# Patient Record
Sex: Male | Born: 1970
Health system: Southern US, Community
[De-identification: ages and names within clinical notes are randomized; demographics above are authoritative.]

## PROBLEM LIST (undated history)

## (undated) DIAGNOSIS — N400 Enlarged prostate without lower urinary tract symptoms: Secondary | ICD-10-CM

## (undated) DIAGNOSIS — G5621 Lesion of ulnar nerve, right upper limb: Secondary | ICD-10-CM

## (undated) DIAGNOSIS — E785 Hyperlipidemia, unspecified: Secondary | ICD-10-CM

## (undated) DIAGNOSIS — J309 Allergic rhinitis, unspecified: Secondary | ICD-10-CM

## (undated) DIAGNOSIS — G4700 Insomnia, unspecified: Secondary | ICD-10-CM

## (undated) DIAGNOSIS — S2239XA Fracture of one rib, unspecified side, initial encounter for closed fracture: Secondary | ICD-10-CM

## (undated) DIAGNOSIS — K5790 Diverticulosis of intestine, part unspecified, without perforation or abscess without bleeding: Secondary | ICD-10-CM

## (undated) DIAGNOSIS — N200 Calculus of kidney: Secondary | ICD-10-CM

## (undated) HISTORY — DX: Hyperlipidemia, unspecified: E78.5

## (undated) HISTORY — DX: Insomnia, unspecified: G47.00

## (undated) HISTORY — PX: OTHER SURGICAL HISTORY: SHX169

## (undated) HISTORY — DX: Allergic rhinitis, unspecified: J30.9

## (undated) HISTORY — DX: Calculus of kidney: N20.0

## (undated) HISTORY — PX: VASECTOMY: SHX75

## (undated) HISTORY — DX: Lesion of ulnar nerve, right upper limb: G56.21

## (undated) HISTORY — DX: Fracture of one rib, unspecified side, initial encounter for closed fracture: S22.39XA

## (undated) HISTORY — DX: Diverticulosis of intestine, part unspecified, without perforation or abscess without bleeding: K57.90

## (undated) HISTORY — DX: Benign prostatic hyperplasia without lower urinary tract symptoms: N40.0

---

## 1998-06-05 ENCOUNTER — Emergency Department (HOSPITAL_COMMUNITY): Admission: EM | Admit: 1998-06-05 | Discharge: 1998-06-05 | Payer: Self-pay

## 2009-08-01 ENCOUNTER — Ambulatory Visit (HOSPITAL_BASED_OUTPATIENT_CLINIC_OR_DEPARTMENT_OTHER): Admission: RE | Admit: 2009-08-01 | Discharge: 2009-08-01 | Payer: Self-pay | Admitting: Orthopedic Surgery

## 2012-01-15 HISTORY — PX: MOLE REMOVAL: SHX2046

## 2013-07-19 ENCOUNTER — Ambulatory Visit
Admission: RE | Admit: 2013-07-19 | Discharge: 2013-07-19 | Disposition: A | Discharge status: Home / Self Care | Payer: BC Managed Care – PPO | Source: Ambulatory Visit | Attending: Internal Medicine | Admitting: Internal Medicine

## 2013-07-19 ENCOUNTER — Other Ambulatory Visit: Payer: Self-pay | Admitting: Internal Medicine

## 2013-07-19 DIAGNOSIS — M549 Dorsalgia, unspecified: Secondary | ICD-10-CM

## 2013-12-02 ENCOUNTER — Encounter: Payer: Self-pay | Admitting: Internal Medicine

## 2013-12-02 NOTE — Telephone Encounter (Signed)
Erroneous encounter

## 2015-01-26 ENCOUNTER — Other Ambulatory Visit: Payer: Self-pay | Admitting: Orthopedic Surgery

## 2015-01-26 ENCOUNTER — Ambulatory Visit
Admission: RE | Admit: 2015-01-26 | Discharge: 2015-01-26 | Disposition: A | Discharge status: Home / Self Care | Payer: BLUE CROSS/BLUE SHIELD | Source: Ambulatory Visit | Attending: Orthopedic Surgery | Admitting: Orthopedic Surgery

## 2015-01-26 DIAGNOSIS — S2220XA Unspecified fracture of sternum, initial encounter for closed fracture: Secondary | ICD-10-CM

## 2015-01-26 DIAGNOSIS — S2239XA Fracture of one rib, unspecified side, initial encounter for closed fracture: Secondary | ICD-10-CM

## 2015-05-19 DIAGNOSIS — J069 Acute upper respiratory infection, unspecified: Secondary | ICD-10-CM | POA: Diagnosis not present

## 2015-06-18 DIAGNOSIS — S0502XD Injury of conjunctiva and corneal abrasion without foreign body, left eye, subsequent encounter: Secondary | ICD-10-CM | POA: Diagnosis not present

## 2015-10-11 DIAGNOSIS — Z23 Encounter for immunization: Secondary | ICD-10-CM | POA: Diagnosis not present

## 2015-11-23 DIAGNOSIS — Z0000 Encounter for general adult medical examination without abnormal findings: Secondary | ICD-10-CM | POA: Diagnosis not present

## 2016-01-22 DIAGNOSIS — Z9852 Vasectomy status: Secondary | ICD-10-CM | POA: Diagnosis not present

## 2016-02-09 DIAGNOSIS — D225 Melanocytic nevi of trunk: Secondary | ICD-10-CM | POA: Diagnosis not present

## 2016-02-09 DIAGNOSIS — D1801 Hemangioma of skin and subcutaneous tissue: Secondary | ICD-10-CM | POA: Diagnosis not present

## 2016-02-09 DIAGNOSIS — L820 Inflamed seborrheic keratosis: Secondary | ICD-10-CM | POA: Diagnosis not present

## 2016-02-09 DIAGNOSIS — D2272 Melanocytic nevi of left lower limb, including hip: Secondary | ICD-10-CM | POA: Diagnosis not present

## 2016-02-09 DIAGNOSIS — L918 Other hypertrophic disorders of the skin: Secondary | ICD-10-CM | POA: Diagnosis not present

## 2016-02-09 DIAGNOSIS — L821 Other seborrheic keratosis: Secondary | ICD-10-CM | POA: Diagnosis not present

## 2016-10-07 DIAGNOSIS — M79675 Pain in left toe(s): Secondary | ICD-10-CM | POA: Diagnosis not present

## 2016-10-30 DIAGNOSIS — L601 Onycholysis: Secondary | ICD-10-CM | POA: Diagnosis not present

## 2016-10-30 DIAGNOSIS — L820 Inflamed seborrheic keratosis: Secondary | ICD-10-CM | POA: Diagnosis not present

## 2017-01-08 DIAGNOSIS — R1031 Right lower quadrant pain: Secondary | ICD-10-CM | POA: Diagnosis not present

## 2017-04-03 DIAGNOSIS — L603 Nail dystrophy: Secondary | ICD-10-CM | POA: Diagnosis not present

## 2017-04-03 DIAGNOSIS — L738 Other specified follicular disorders: Secondary | ICD-10-CM | POA: Diagnosis not present

## 2017-04-03 DIAGNOSIS — D1801 Hemangioma of skin and subcutaneous tissue: Secondary | ICD-10-CM | POA: Diagnosis not present

## 2017-04-03 DIAGNOSIS — L820 Inflamed seborrheic keratosis: Secondary | ICD-10-CM | POA: Diagnosis not present

## 2017-04-03 DIAGNOSIS — L609 Nail disorder, unspecified: Secondary | ICD-10-CM | POA: Diagnosis not present

## 2017-04-03 DIAGNOSIS — D225 Melanocytic nevi of trunk: Secondary | ICD-10-CM | POA: Diagnosis not present

## 2017-09-04 DIAGNOSIS — M79605 Pain in left leg: Secondary | ICD-10-CM | POA: Diagnosis not present

## 2017-09-08 DIAGNOSIS — M79605 Pain in left leg: Secondary | ICD-10-CM | POA: Diagnosis not present

## 2017-09-11 DIAGNOSIS — M79605 Pain in left leg: Secondary | ICD-10-CM | POA: Diagnosis not present

## 2017-09-16 DIAGNOSIS — M79605 Pain in left leg: Secondary | ICD-10-CM | POA: Diagnosis not present

## 2017-09-19 DIAGNOSIS — M79605 Pain in left leg: Secondary | ICD-10-CM | POA: Diagnosis not present

## 2017-09-23 DIAGNOSIS — M79605 Pain in left leg: Secondary | ICD-10-CM | POA: Diagnosis not present

## 2017-09-26 DIAGNOSIS — M79605 Pain in left leg: Secondary | ICD-10-CM | POA: Diagnosis not present

## 2017-09-30 DIAGNOSIS — M79605 Pain in left leg: Secondary | ICD-10-CM | POA: Diagnosis not present

## 2017-10-09 DIAGNOSIS — S20212A Contusion of left front wall of thorax, initial encounter: Secondary | ICD-10-CM | POA: Diagnosis not present

## 2017-12-09 DIAGNOSIS — Z23 Encounter for immunization: Secondary | ICD-10-CM | POA: Diagnosis not present

## 2017-12-09 DIAGNOSIS — Z1211 Encounter for screening for malignant neoplasm of colon: Secondary | ICD-10-CM | POA: Diagnosis not present

## 2017-12-09 DIAGNOSIS — Z0000 Encounter for general adult medical examination without abnormal findings: Secondary | ICD-10-CM | POA: Diagnosis not present

## 2017-12-09 DIAGNOSIS — G5621 Lesion of ulnar nerve, right upper limb: Secondary | ICD-10-CM | POA: Diagnosis not present

## 2018-02-02 DIAGNOSIS — M79601 Pain in right arm: Secondary | ICD-10-CM | POA: Diagnosis not present

## 2018-02-02 DIAGNOSIS — M25511 Pain in right shoulder: Secondary | ICD-10-CM | POA: Diagnosis not present

## 2018-02-02 DIAGNOSIS — M79602 Pain in left arm: Secondary | ICD-10-CM | POA: Diagnosis not present

## 2018-02-09 DIAGNOSIS — M25511 Pain in right shoulder: Secondary | ICD-10-CM | POA: Diagnosis not present

## 2018-02-17 DIAGNOSIS — M25511 Pain in right shoulder: Secondary | ICD-10-CM | POA: Diagnosis not present

## 2018-02-26 DIAGNOSIS — M25511 Pain in right shoulder: Secondary | ICD-10-CM | POA: Diagnosis not present

## 2018-03-23 DIAGNOSIS — J0100 Acute maxillary sinusitis, unspecified: Secondary | ICD-10-CM | POA: Diagnosis not present

## 2018-05-28 DIAGNOSIS — L821 Other seborrheic keratosis: Secondary | ICD-10-CM | POA: Diagnosis not present

## 2018-05-28 DIAGNOSIS — D485 Neoplasm of uncertain behavior of skin: Secondary | ICD-10-CM | POA: Diagnosis not present

## 2018-05-28 DIAGNOSIS — D225 Melanocytic nevi of trunk: Secondary | ICD-10-CM | POA: Diagnosis not present

## 2018-05-28 DIAGNOSIS — L570 Actinic keratosis: Secondary | ICD-10-CM | POA: Diagnosis not present

## 2018-05-28 DIAGNOSIS — B078 Other viral warts: Secondary | ICD-10-CM | POA: Diagnosis not present

## 2018-05-28 DIAGNOSIS — L738 Other specified follicular disorders: Secondary | ICD-10-CM | POA: Diagnosis not present

## 2018-05-28 DIAGNOSIS — L814 Other melanin hyperpigmentation: Secondary | ICD-10-CM | POA: Diagnosis not present

## 2018-10-23 DIAGNOSIS — H109 Unspecified conjunctivitis: Secondary | ICD-10-CM | POA: Diagnosis not present

## 2018-11-07 DIAGNOSIS — Z20828 Contact with and (suspected) exposure to other viral communicable diseases: Secondary | ICD-10-CM | POA: Diagnosis not present

## 2018-12-09 ENCOUNTER — Other Ambulatory Visit: Payer: Self-pay

## 2018-12-09 DIAGNOSIS — Z20822 Contact with and (suspected) exposure to covid-19: Secondary | ICD-10-CM

## 2018-12-10 LAB — NOVEL CORONAVIRUS, NAA: SARS-CoV-2, NAA: NOT DETECTED

## 2018-12-23 DIAGNOSIS — Z125 Encounter for screening for malignant neoplasm of prostate: Secondary | ICD-10-CM | POA: Diagnosis not present

## 2018-12-23 DIAGNOSIS — Z23 Encounter for immunization: Secondary | ICD-10-CM | POA: Diagnosis not present

## 2018-12-23 DIAGNOSIS — Z13220 Encounter for screening for lipoid disorders: Secondary | ICD-10-CM | POA: Diagnosis not present

## 2018-12-23 DIAGNOSIS — Z0000 Encounter for general adult medical examination without abnormal findings: Secondary | ICD-10-CM | POA: Diagnosis not present

## 2019-06-21 DIAGNOSIS — R05 Cough: Secondary | ICD-10-CM | POA: Diagnosis not present

## 2019-06-21 DIAGNOSIS — J0110 Acute frontal sinusitis, unspecified: Secondary | ICD-10-CM | POA: Diagnosis not present

## 2019-06-21 DIAGNOSIS — J302 Other seasonal allergic rhinitis: Secondary | ICD-10-CM | POA: Diagnosis not present

## 2019-07-21 DIAGNOSIS — L821 Other seborrheic keratosis: Secondary | ICD-10-CM | POA: Diagnosis not present

## 2019-07-21 DIAGNOSIS — D1801 Hemangioma of skin and subcutaneous tissue: Secondary | ICD-10-CM | POA: Diagnosis not present

## 2019-07-21 DIAGNOSIS — B078 Other viral warts: Secondary | ICD-10-CM | POA: Diagnosis not present

## 2019-07-21 DIAGNOSIS — D2271 Melanocytic nevi of right lower limb, including hip: Secondary | ICD-10-CM | POA: Diagnosis not present

## 2019-08-17 DIAGNOSIS — Z20822 Contact with and (suspected) exposure to covid-19: Secondary | ICD-10-CM | POA: Diagnosis not present

## 2019-11-29 ENCOUNTER — Ambulatory Visit (INDEPENDENT_AMBULATORY_CARE_PROVIDER_SITE_OTHER): Payer: BC Managed Care – PPO | Admitting: Family Medicine

## 2019-11-29 ENCOUNTER — Other Ambulatory Visit: Payer: Self-pay

## 2019-11-29 DIAGNOSIS — M545 Low back pain, unspecified: Secondary | ICD-10-CM
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
## 2020-01-21 DIAGNOSIS — Z23 Encounter for immunization: Secondary | ICD-10-CM | POA: Diagnosis not present

## 2020-01-21 DIAGNOSIS — Z125 Encounter for screening for malignant neoplasm of prostate: Secondary | ICD-10-CM | POA: Diagnosis not present

## 2020-01-21 DIAGNOSIS — Z13220 Encounter for screening for lipoid disorders: Secondary | ICD-10-CM | POA: Diagnosis not present

## 2020-01-21 DIAGNOSIS — Z0000 Encounter for general adult medical examination without abnormal findings: Secondary | ICD-10-CM | POA: Diagnosis not present

## 2020-07-14 DIAGNOSIS — Z1211 Encounter for screening for malignant neoplasm of colon: Secondary | ICD-10-CM | POA: Diagnosis not present

## 2020-08-07 DIAGNOSIS — M79605 Pain in left leg: Secondary | ICD-10-CM | POA: Diagnosis not present

## 2020-09-14 HISTORY — PX: COLONOSCOPY: SHX174

## 2020-09-22 DIAGNOSIS — K5730 Diverticulosis of large intestine without perforation or abscess without bleeding: Secondary | ICD-10-CM | POA: Diagnosis not present

## 2020-09-22 DIAGNOSIS — D122 Benign neoplasm of ascending colon: Secondary | ICD-10-CM | POA: Diagnosis not present

## 2020-09-22 DIAGNOSIS — Z8371 Family history of colonic polyps: Secondary | ICD-10-CM | POA: Diagnosis not present

## 2020-09-22 DIAGNOSIS — Z1211 Encounter for screening for malignant neoplasm of colon: Secondary | ICD-10-CM | POA: Diagnosis not present

## 2020-09-22 DIAGNOSIS — D120 Benign neoplasm of cecum: Secondary | ICD-10-CM | POA: Diagnosis not present

## 2020-09-22 DIAGNOSIS — K648 Other hemorrhoids: Secondary | ICD-10-CM | POA: Diagnosis not present

## 2020-10-13 DIAGNOSIS — M25511 Pain in right shoulder: Secondary | ICD-10-CM | POA: Diagnosis not present

## 2020-10-17 DIAGNOSIS — M25511 Pain in right shoulder: Secondary | ICD-10-CM | POA: Diagnosis not present

## 2020-11-01 ENCOUNTER — Ambulatory Visit: Payer: Self-pay

## 2020-11-01 ENCOUNTER — Other Ambulatory Visit: Payer: Self-pay

## 2020-11-01 ENCOUNTER — Encounter: Payer: Self-pay | Admitting: Orthopedic Surgery

## 2020-11-01 ENCOUNTER — Ambulatory Visit: Payer: BC Managed Care – PPO | Admitting: Orthopedic Surgery

## 2020-11-01 DIAGNOSIS — M25511 Pain in right shoulder: Secondary | ICD-10-CM | POA: Diagnosis not present

## 2020-11-05 ENCOUNTER — Encounter: Payer: Self-pay | Admitting: Orthopedic Surgery
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
## 2020-11-20 ENCOUNTER — Ambulatory Visit
Admission: RE | Admit: 2020-11-20 | Discharge: 2020-11-20 | Disposition: A | Discharge status: Home / Self Care | Payer: BC Managed Care – PPO | Source: Ambulatory Visit | Attending: Orthopedic Surgery | Admitting: Orthopedic Surgery

## 2020-11-20 ENCOUNTER — Other Ambulatory Visit: Payer: Self-pay

## 2020-11-20 DIAGNOSIS — M25511 Pain in right shoulder: Secondary | ICD-10-CM | POA: Diagnosis not present

## 2020-11-20 DIAGNOSIS — M75111 Incomplete rotator cuff tear or rupture of right shoulder, not specified as traumatic: Secondary | ICD-10-CM | POA: Diagnosis not present

## 2020-11-20 MED ORDER — IOPAMIDOL (ISOVUE-M 200) INJECTION 41%
12.0000 mL | Freq: Once | INTRAMUSCULAR | Status: AC
  Administered 2020-11-20: 12 mL via INTRA_ARTICULAR

## 2020-11-27 ENCOUNTER — Other Ambulatory Visit: Payer: Self-pay

## 2020-11-27 ENCOUNTER — Ambulatory Visit (INDEPENDENT_AMBULATORY_CARE_PROVIDER_SITE_OTHER): Payer: BC Managed Care – PPO | Admitting: Surgical

## 2020-11-27 DIAGNOSIS — M19011 Primary osteoarthritis, right shoulder: Secondary | ICD-10-CM | POA: Diagnosis not present

## 2020-11-27 MED ORDER — CELECOXIB 100 MG PO CAPS
100.0000 mg | ORAL_CAPSULE | Freq: Two times a day (BID) | ORAL | 0 refills | Status: DC | PRN
Start: 2020-11-27 — End: 2023-05-27

## 2020-11-28 ENCOUNTER — Telehealth: Payer: Self-pay

## 2020-11-28 NOTE — Telephone Encounter (Signed)
Submitted PA for celebrex on [email or web address]

## 2020-11-29 DIAGNOSIS — M25511 Pain in right shoulder: Secondary | ICD-10-CM | POA: Diagnosis not present

## 2020-12-03 ENCOUNTER — Encounter: Payer: Self-pay | Admitting: Orthopedic Surgery

## 2020-12-03 NOTE — Progress Notes (Signed)
Office Visit Note   Patient: Joshua Yu           Date of Birth: 1970-11-20           MRN: [id number] Visit Date: 11/27/2020 Requested by: [facility] [address] [place],  [place] [address] PCP: Lorenda Ishihara, MD  Subjective: Chief Complaint  Patient presents with   Right Shoulder - Follow-up    MRI right shoulder arthrogram review    HPI: Joshua Yu is a 50 y.o. male who presents to the office for MRI review. Patient denies any changes in symptoms.  Continues to complain mainly of anterior pain in the right shoulder baseline with lateral mid humerus pain when his pain flares up.  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  He is not taking any medication for pain at this time.  He tries to be careful to keep his shoulder from getting too inflamed.  No history of diabetes.  MRI results revealed: MR SHOULDER RIGHT W CONTRAST  Result Date: 11/22/2020 CLINICAL DATA:  Right shoulder pain over the last 2 months. Prior right shoulder surgery remotely. EXAM: MR ARTHROGRAM OF THE right SHOULDER TECHNIQUE: Multiplanar, multisequence MR imaging of the right shoulder was performed following the administration of intra-articular contrast. CONTRAST:  See Injection Documentation. COMPARISON:  Radiographs 11/01/2020 and MRI arthrogram report from the right shoulder dated 02/26/2012 FINDINGS: Rotator cuff: Mild supraspinatus tendinopathy noted with a small partial thickness articular surface tear of the distal supraspinatus tendon on image 12 series 11 and image 20 series 8. Muscles: Unremarkable Biceps long head: Unremarkable Acromioclavicular Joint: Mild spurring and moderate subcortical marrow edema compatible with moderate degenerative arthropathy. Subacromial morphology is type 2 (curved). No contrast extension into the subacromial subdeltoid bursa. Glenohumeral Joint: Moderate spurring of the right humeral  head with multifocal partial-thickness chondral defects and chondral irregularity especially along the upper margin of the humeral head, for example on images 7 through 17 of series 11, with additional chondral surface irregularity anteriorly along the humeral head. Glenoid articular cartilage appears reasonably well preserved. Labrum: Prior labral repair with anterior, posterior, and inferior screw tracks noted. There is some blunting of the posterior labrum with mild irregularity along the posterior labor capsular junction for example on images 14-17 of series 7, but without a well-defined tear extending through the labral tissue. Contrast along the superior labrum does not extend substantially posterior to the biceps anchor. This of subtle linear signal on ABER images along the anterior inferior chondrolabral junction for example on images 11 through 12 of series 14, but this could well be related to the prior repair tear given the lack of a well-defined Bankart lesion. Bones: No significant extra-articular osseous abnormalities identified. IMPRESSION: 1. Substantial chondral irregularity along the humeral head especially superiorly and anteriorly, with multifocal partial thickness chondral defects. Associated humeral head spurring. 2. Three screw tracks from prior labral repair (anterior, posterior, and inferior). There is some blunting of the posterior labrum as well as thin signal which may be due to remote healed tear along the anterior and anterior inferior labrum, without a substantial amount of contrast extending into this region. I do not see a well-defined acute tear. Contrast along the superior labrum does not appear to extend substantially posterior to the biceps anchor and accordingly is not thought to constitute a definite SLAP tear. 3. Mild supraspinatus tendinopathy with a partial thickness articular surface tear of the distal supraspinatus  tendon. Electronically Signed   By: Gaylyn Rong  M.D.   On: 11/22/2020 08:52                 ROS: All systems reviewed are negative as they relate to the chief complaint within the history of present illness.  Patient denies fevers or chills.  Assessment & Plan: Visit Diagnoses:  1. Glenohumeral arthritis, right     Plan: Joshua Yu is a 50 y.o. male who presents to the office for review of right shoulder MRI scan.  Discussed the right shoulder findings in depth with the MRI demonstrating moderate spurring of the humeral head with associated extensive partial-thickness chondral defects of the humeral head without much wear of the glenoid.  No well-defined acute Bankart lesion or evidence of SLAP tear.  Small partial-thickness supraspinatus tear.  After discussion of pathology and reviewing the imaging, discussed options available to patient.  He has glenohumeral arthritis with options for this mainly being keeping active to preserve his shoulder range of motion as best as possible while avoiding lifts that will aggravate his shoulder such as push-ups, bench press, overhead press.  Strikes during taekwondo could act to apply force through the glenohumeral joint in order to flareup his pain so he will have to be judicious as far as how he continues with his taekwondo.    Other options are trying anti-inflammatories as he is not currently taking any medications versus occasional cortisone injections versus surgery which would entail some sort of shoulder arthroplasty.  Would anticipate anatomic total shoulder arthroplasty based on his excellent rotator cuff strength and minimal cuff pathology but this could change in the future..  Recommended he hold off on surgery for as long as he can.  He would not like to try any cortisone injection at this time and he would like to try anti-inflammatory first to see how much of an effect this will have.  Prescribe Celebrex to take as needed for pain control.  He will try this and activity modification with  cortisone injection to follow if no significant improvement.  Patient agreed with plan.  Follow-up as needed.  Follow-Up Instructions: No follow-ups on file.   Orders:  No orders of the defined types were placed in this encounter.  Meds ordered this encounter  Medications   celecoxib (CELEBREX) 100 MG capsule    Sig: Take 1 capsule (100 mg total) by mouth every 12 (twelve) hours as needed.    Dispense:  40 capsule    Refill:  0      Procedures: No procedures performed   Clinical Data: No additional findings.  Objective: Vital Signs: There were no vitals taken for this visit.  Physical Exam:  Constitutional: Patient appears well-developed HEENT:  Head: Normocephalic Eyes:EOM are normal Neck: Normal range of motion Cardiovascular: Normal rate Pulmonary/chest: Effort normal Neurologic: Patient is alert Skin: Skin is warm Psychiatric: Patient has normal mood and affect  Ortho Exam: Ortho exam demonstrates right shoulder with 40 degrees external rotation, 100 degrees abduction, 170 degrees forward flexion.  Crepitus noted with motion of the right shoulder consistent with osteoarthritis.  Axillary nerve intact with deltoid firing.  Rotator cuff strength rated 5/5 of supraspinatus, infraspinatus, subscapularis.   Specialty Comments:  No specialty comments available.  Imaging: No results found.   PMFS History: There are no problems to display for this patient.  No past medical history on file.  No family history on file.  No past surgical history on file. Social  History   Occupational History   Not on file  Tobacco Use   Smoking status: Not on file   Smokeless tobacco: Not on file  Substance and Sexual Activity   Alcohol use: Not on file   Drug use: Not on file   Sexual activity: Not on file

## 2020-12-22 DIAGNOSIS — J0110 Acute frontal sinusitis, unspecified: Secondary | ICD-10-CM | POA: Diagnosis not present

## 2020-12-22 DIAGNOSIS — R051 Acute cough: Secondary | ICD-10-CM | POA: Diagnosis not present

## 2020-12-22 DIAGNOSIS — J302 Other seasonal allergic rhinitis: Secondary | ICD-10-CM | POA: Diagnosis not present

## 2021-01-02 DIAGNOSIS — L738 Other specified follicular disorders: Secondary | ICD-10-CM | POA: Diagnosis not present

## 2021-01-02 DIAGNOSIS — D2261 Melanocytic nevi of right upper limb, including shoulder: Secondary | ICD-10-CM | POA: Diagnosis not present

## 2021-01-02 DIAGNOSIS — L821 Other seborrheic keratosis: Secondary | ICD-10-CM | POA: Diagnosis not present

## 2021-01-02 DIAGNOSIS — D225 Melanocytic nevi of trunk: Secondary | ICD-10-CM | POA: Diagnosis not present

## 2021-01-24 DIAGNOSIS — Z125 Encounter for screening for malignant neoplasm of prostate: Secondary | ICD-10-CM | POA: Diagnosis not present

## 2021-01-24 DIAGNOSIS — Z13220 Encounter for screening for lipoid disorders: Secondary | ICD-10-CM | POA: Diagnosis not present

## 2021-01-24 DIAGNOSIS — K5730 Diverticulosis of large intestine without perforation or abscess without bleeding: Secondary | ICD-10-CM | POA: Diagnosis not present

## 2021-01-24 DIAGNOSIS — Z0000 Encounter for general adult medical examination without abnormal findings: Secondary | ICD-10-CM | POA: Diagnosis not present

## 2021-04-05 DIAGNOSIS — E785 Hyperlipidemia, unspecified: Secondary | ICD-10-CM | POA: Diagnosis not present

## 2021-04-05 DIAGNOSIS — R030 Elevated blood-pressure reading, without diagnosis of hypertension: Secondary | ICD-10-CM | POA: Diagnosis not present

## 2021-04-19 DIAGNOSIS — Z0130 Encounter for examination of blood pressure without abnormal findings: Secondary | ICD-10-CM | POA: Diagnosis not present

## 2021-11-26 DIAGNOSIS — M25511 Pain in right shoulder: Secondary | ICD-10-CM | POA: Diagnosis not present

## 2021-12-11 DIAGNOSIS — M25511 Pain in right shoulder: Secondary | ICD-10-CM | POA: Diagnosis not present

## 2022-01-22 DIAGNOSIS — L738 Other specified follicular disorders: Secondary | ICD-10-CM | POA: Diagnosis not present

## 2022-01-22 DIAGNOSIS — L812 Freckles: Secondary | ICD-10-CM | POA: Diagnosis not present

## 2022-01-22 DIAGNOSIS — D225 Melanocytic nevi of trunk: Secondary | ICD-10-CM | POA: Diagnosis not present

## 2022-01-22 DIAGNOSIS — L821 Other seborrheic keratosis: Secondary | ICD-10-CM | POA: Diagnosis not present

## 2022-02-26 DIAGNOSIS — N400 Enlarged prostate without lower urinary tract symptoms: Secondary | ICD-10-CM | POA: Diagnosis not present

## 2022-02-26 DIAGNOSIS — K5730 Diverticulosis of large intestine without perforation or abscess without bleeding: Secondary | ICD-10-CM | POA: Diagnosis not present

## 2022-02-26 DIAGNOSIS — Z125 Encounter for screening for malignant neoplasm of prostate: Secondary | ICD-10-CM | POA: Diagnosis not present

## 2022-02-26 DIAGNOSIS — Z0000 Encounter for general adult medical examination without abnormal findings: Secondary | ICD-10-CM | POA: Diagnosis not present

## 2022-02-26 DIAGNOSIS — E785 Hyperlipidemia, unspecified: Secondary | ICD-10-CM | POA: Diagnosis not present

## 2022-06-01 ENCOUNTER — Other Ambulatory Visit: Payer: Self-pay

## 2022-06-01 ENCOUNTER — Encounter (HOSPITAL_BASED_OUTPATIENT_CLINIC_OR_DEPARTMENT_OTHER): Payer: Self-pay

## 2022-06-01 ENCOUNTER — Emergency Department (HOSPITAL_BASED_OUTPATIENT_CLINIC_OR_DEPARTMENT_OTHER)
Admission: EM | Admit: 2022-06-01 | Discharge: 2022-06-01 | Disposition: A | Discharge status: Home / Self Care | Payer: BC Managed Care – PPO | Attending: Emergency Medicine | Admitting: Emergency Medicine

## 2022-06-01 ENCOUNTER — Emergency Department (HOSPITAL_BASED_OUTPATIENT_CLINIC_OR_DEPARTMENT_OTHER): Payer: BC Managed Care – PPO

## 2022-06-01 DIAGNOSIS — S6991XA Unspecified injury of right wrist, hand and finger(s), initial encounter: Secondary | ICD-10-CM | POA: Diagnosis not present

## 2022-06-01 DIAGNOSIS — X501XXA Overexertion from prolonged static or awkward postures, initial encounter: Secondary | ICD-10-CM | POA: Insufficient documentation

## 2022-06-01 DIAGNOSIS — M25531 Pain in right wrist: Secondary | ICD-10-CM | POA: Diagnosis not present

## 2022-06-01 NOTE — ED Provider Notes (Signed)
[facility] AT [facility] Provider Note   CSN: [id number] Arrival date & time: 06/01/22  1803     History  Chief Complaint  Patient presents with   Wrist Injury    JAHSHUA EDSALL is a 52 y.o. male.  Patient presents to the emergency department today for evaluation of right wrist pain.  Patient was doing Forensic scientist.  He states that he was able to break a board but afterwards had pain in the wrist that is exacerbated by certain movements.  No significant swelling.  No pain in the fingers or elbow.  States that he feels it more when he reaches to put his seatbelt on or use a hand soap dispenser.      Home Medications Prior to Admission medications   Medication Sig Start Date End Date Taking? Authorizing Provider  celecoxib (CELEBREX) 100 MG capsule Take 1 capsule (100 mg total) by mouth every 12 (twelve) hours as needed. 11/27/20   Magnant, Charles L, PA-C  cetirizine (ZYRTEC) 10 MG tablet Take 10 mg by mouth daily as needed for allergies.    [provider]  fluticasone (FLONASE) 50 MCG/ACT nasal spray Place 1 spray into both nostrils daily.    [provider]      Allergies    Amoxicillin    Review of Systems   Review of Systems  Physical Exam Updated Vital Signs BP (!) 167/91 (BP Location: Right Arm)   Pulse 65   Temp (!) 96.7 F (35.9 C) (Temporal)   Resp 18   Ht 5\' 8"  (1.727 m)   Wt 70.3 kg   SpO2 100%   BMI 23.57 kg/m  Physical Exam Vitals and nursing note reviewed.  Constitutional:      Appearance: He is well-developed.  HENT:     Head: Normocephalic and atraumatic.  Eyes:     Conjunctiva/sclera: Conjunctivae normal.  Cardiovascular:     Pulses: Normal pulses. No decreased pulses.  Musculoskeletal:        General: Tenderness present.     Right wrist: Tenderness and bony tenderness (Over the distal ulna) present. No swelling, deformity or snuff box tenderness. Normal range of motion.      Cervical back: Normal range of motion and neck supple.     Right lower leg: No edema.     Left lower leg: No edema.  Skin:    General: Skin is warm and dry.  Neurological:     Mental Status: He is alert.     Sensory: No sensory deficit.     Comments: Motor, sensation, and vascular distal to the injury is fully intact.   Psychiatric:        Mood and Affect: Mood normal.    ED Results / Procedures / Treatments   Labs (all labs ordered are listed, but only abnormal results are displayed) Labs Reviewed - No data to display  EKG None  Radiology DG Wrist Complete Right  Result Date: 06/01/2022 CLINICAL DATA:  Wrist and hand injury after breaking board EXAM: RIGHT WRIST - COMPLETE 3+ VIEW COMPARISON:  None Available. FINDINGS: There is no evidence of fracture or dislocation. There is no evidence of arthropathy or other focal bone abnormality. Soft tissues are unremarkable. IMPRESSION: Negative. Electronically Signed   By: Minerva Fester M.D.   On: 06/01/2022 18:49    Procedures Procedures    Medications Ordered in ED Medications - No data to display  ED Course/ Medical Decision Making/ A&P  Patient seen and examined. History obtained directly from patient.   Labs/EKG: None ordered  Imaging: Ordered x-ray of the right wrist.  Medications/Fluids: None ordered  Most recent vital signs reviewed and are as follows: BP (!) 167/91 (BP Location: Right Arm)   Pulse 65   Temp (!) 96.7 F (35.9 C) (Temporal)   Resp 18   Ht 5\' 8"  (1.727 m)   Wt 70.3 kg   SpO2 100%   BMI 23.57 kg/m   Initial impression: Right wrist pain  6:38 PM Reassessment performed. Patient appears stable.  Imaging personally visualized and interpreted including: Wrist x-ray, agree negative  Reviewed pertinent lab work and imaging with patient at bedside. Questions answered.   Most current vital signs reviewed and are as follows: BP (!) 167/91 (BP Location: Right Arm)   Pulse 65   Temp (!) 96.7 F  (35.9 C) (Temporal)   Resp 18   Ht 5\' 8"  (1.727 m)   Wt 70.3 kg   SpO2 100%   BMI 23.57 kg/m   Plan: Discharge to home.   Prescriptions written for: None, recommended over-the-counter medications as needed for pain  Other home care instructions discussed: RICE protocol  ED return instructions discussed: New or worsening symptoms  Follow-up instructions discussed: Patient encouraged to follow-up with their PCP or orthopedic referral in 1 week.                            Medical Decision Making Amount and/or Complexity of Data Reviewed Radiology: ordered.   Patient with right wrist injury today while practicing taekwondo.  Suspect sprain given pain with certain motions.  X-ray negative for fracture.  No snuffbox tenderness.  Hand appears normal.  Distal circulation, motor, sensation intact.        Final Clinical Impression(s) / ED Diagnoses Final diagnoses:  Right wrist pain    Rx / DC Orders ED Discharge Orders     None         Renne Crigler, Cordelia Poche 06/01/22 1855    Terald Sleeper, MD 06/01/22 [phone]

## 2022-06-01 NOTE — Discharge Instructions (Signed)
Please read and follow all provided instructions.  Your diagnoses today include:  1. Right wrist pain     Tests performed today include: An x-ray of the affected area - does NOT show any broken bones Vital signs. See below for your results today.   Medications prescribed:  Please use over-the-counter NSAID medications (ibuprofen, naproxen) or Tylenol (acetaminophen) as directed on the packaging for pain -- as long as you do not have any reasons avoid these medications. Reasons to avoid NSAID medications include: weak kidneys, a history of bleeding in your stomach or gut, or uncontrolled high blood pressure or previous heart attack. Reasons to avoid Tylenol include: liver problems or ongoing alcohol use. Never take more than 4000mg  or 8 Extra strength Tylenol in a 24 hour period.     Take any prescribed medications only as directed.  Home care instructions:  Follow any educational materials contained in this packet Follow R.I.C.E. Protocol: R - rest your injury  I  - use ice on injury without applying directly to skin C - compress injury with bandage or splint E - elevate the injury as much as possible  Follow-up instructions: Please follow-up with your primary care provider or the provided orthopedic physician (bone specialist) if you continue to have significant pain in 1 week. In this case you may have a more severe injury that requires further care.   Return instructions:  Please return if your fingers are numb or tingling, appear gray or blue, or you have severe pain (also elevate the arm and loosen splint or wrap if you were given one) Please return to the Emergency Department if you experience worsening symptoms.  Please return if you have any other emergent concerns.  Additional Information:  Your vital signs today were: BP (!) 167/91 (BP Location: Right Arm)   Pulse 65   Temp (!) 96.7 F (35.9 C) (Temporal)   Resp 18   Ht 5\' 8"  (1.727 m)   Wt 70.3 kg   SpO2 100%    BMI 23.57 kg/m  If your blood pressure (BP) was elevated above 135/85 this visit, please have this repeated by your doctor within one month. --------------

## 2022-06-01 NOTE — ED Triage Notes (Signed)
Patient here POV from Home.  Endorses breaking boards this AM causing pain to Right Wrist and Hand. Pain has not subsided since.  NAD Noted during Triage. A&Ox4. GCS 15. Ambulatory.

## 2022-06-18 DIAGNOSIS — M25511 Pain in right shoulder: Secondary | ICD-10-CM | POA: Diagnosis not present

## 2022-07-26 DIAGNOSIS — R202 Paresthesia of skin: Secondary | ICD-10-CM | POA: Diagnosis not present

## 2022-07-26 DIAGNOSIS — E559 Vitamin D deficiency, unspecified: Secondary | ICD-10-CM | POA: Diagnosis not present

## 2022-07-26 DIAGNOSIS — E785 Hyperlipidemia, unspecified: Secondary | ICD-10-CM | POA: Diagnosis not present

## 2022-08-14 DIAGNOSIS — E785 Hyperlipidemia, unspecified: Secondary | ICD-10-CM | POA: Diagnosis not present

## 2022-10-03 IMAGING — MR MR SHOULDER*R* W/CM
4 of 6 series · 13 of 40 positions shown · IV contrast (agent unspecified)
Comparison: Radiographs 11/01/2020 and MRI arthrogram report from
the right shoulder dated 02/26/2012

CLINICAL DATA: Right shoulder pain over the last 2 months. Prior
right shoulder surgery remotely.

EXAM:
MR ARTHROGRAM OF THE right SHOULDER
TECHNIQUE: Multiplanar, multisequence MR imaging of the right shoulder was
performed following the administration of intra-articular contrast.
CONTRAST:  See Injection Documentation.

[Series 7: T1 fat-sat · axial · right · 3.0mm · 0.36mm/px · z∈[-16,+56]mm · 3 of 32 slices shown (1 of 2)]
[im 6/32]
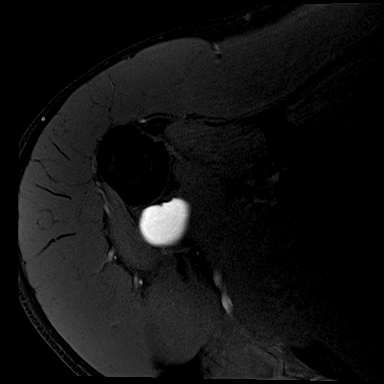
[im 16/32]
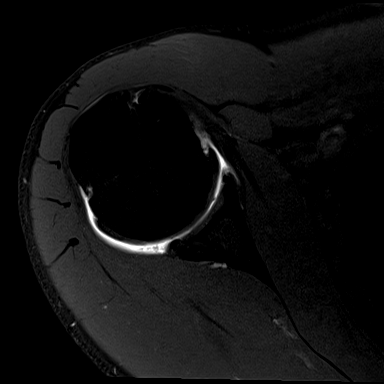
[im 26/32]
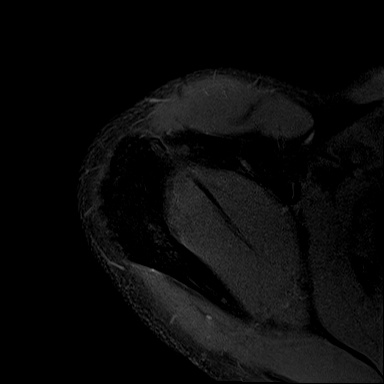

[Series 8: T2 fat-sat · oblique · right · 3.0mm · 0.22mm/px · 4 of 26 slices shown (1 of 2)]
[im 1/26]
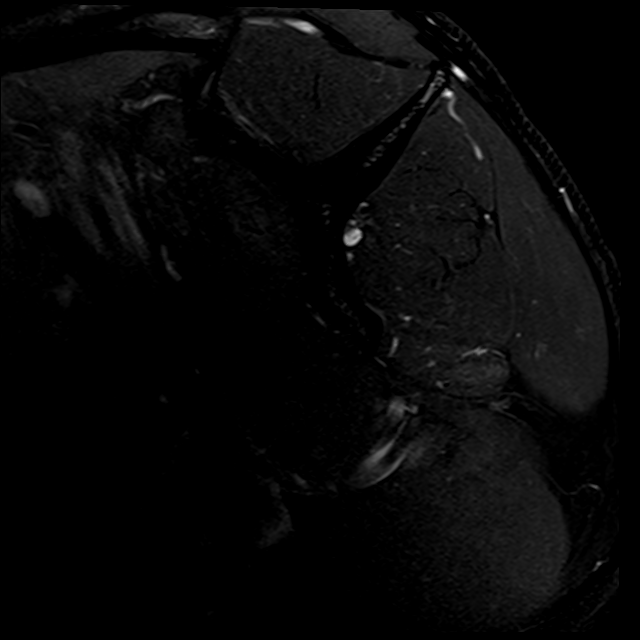
[im 6/26]
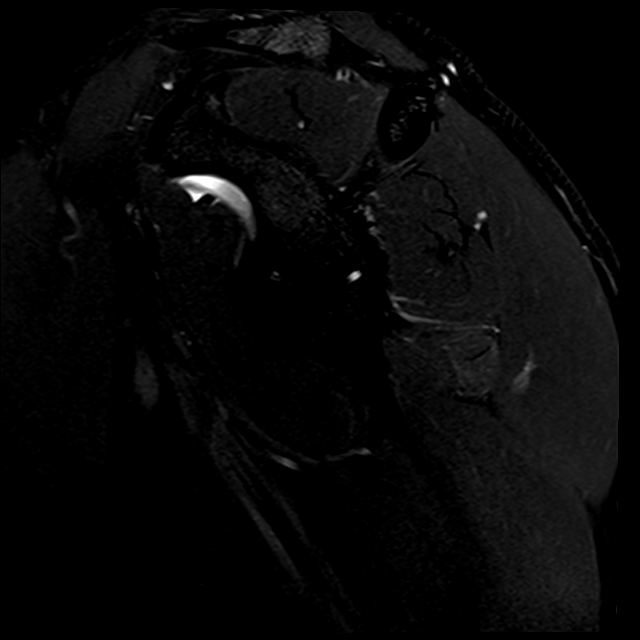
[im 16/26]
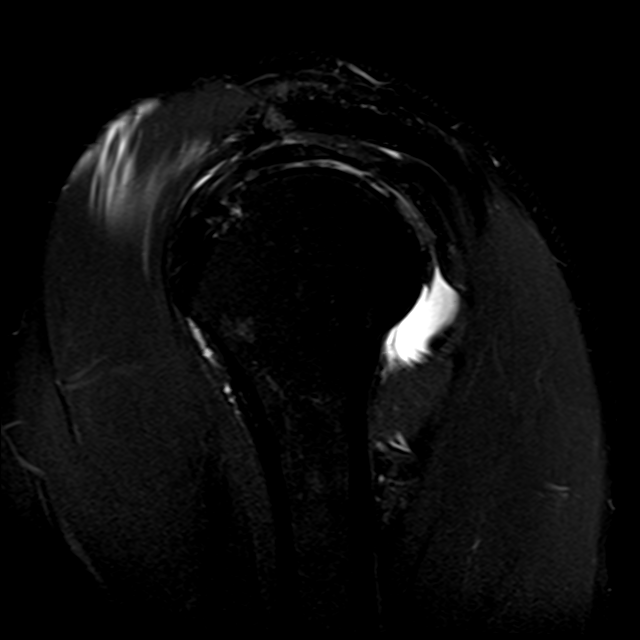
[im 26/26]
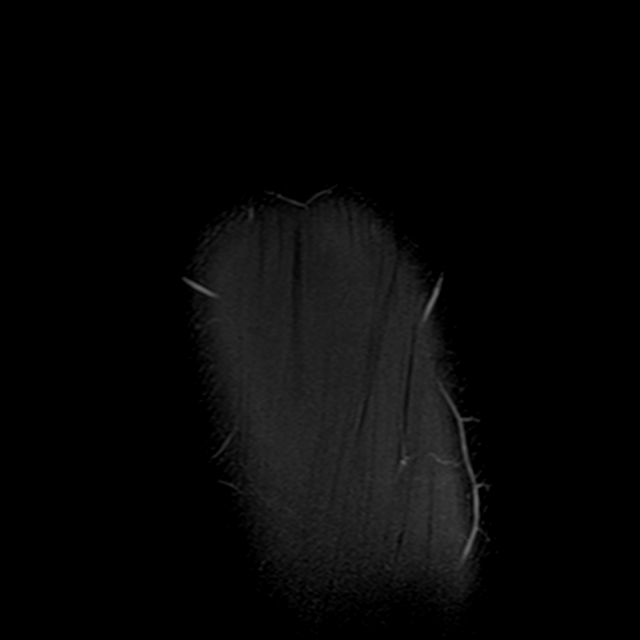

[Series 9: T1 fat-sat · oblique · right · 3.0mm · 0.18mm/px · 3 of 26 slices shown (2 of 2)]
[im 5/26]
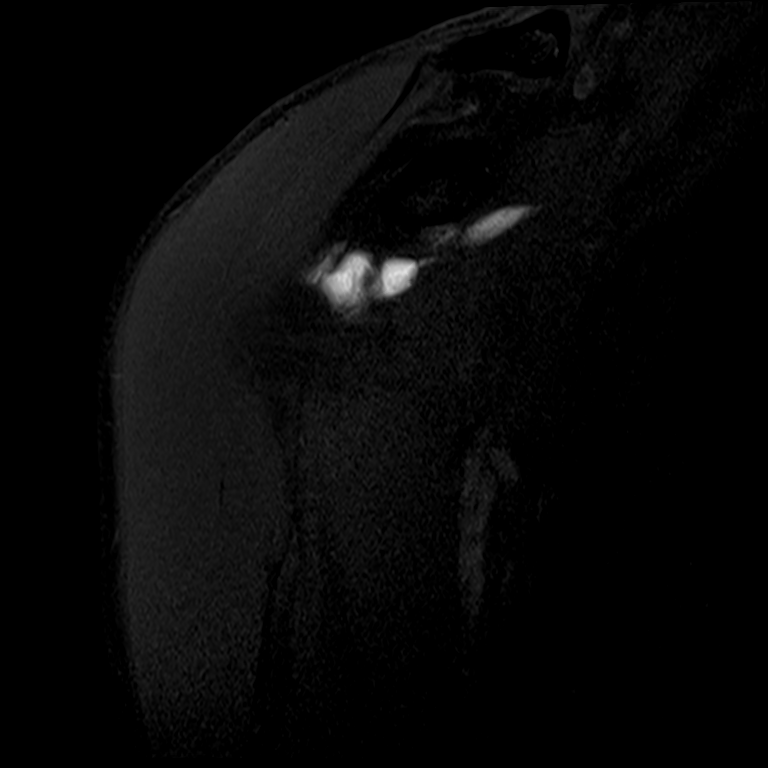
[im 13/26]
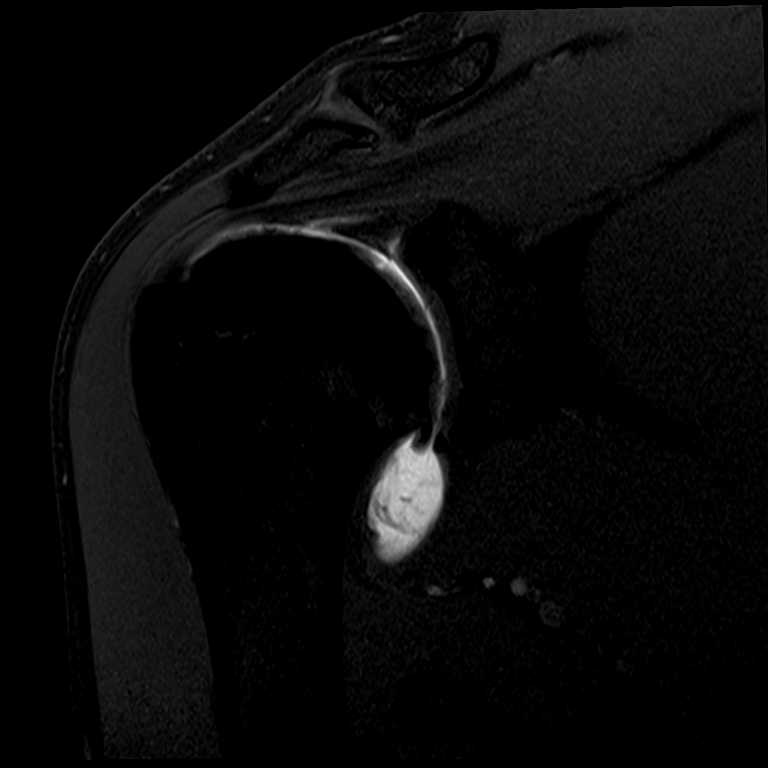
[im 21/26]
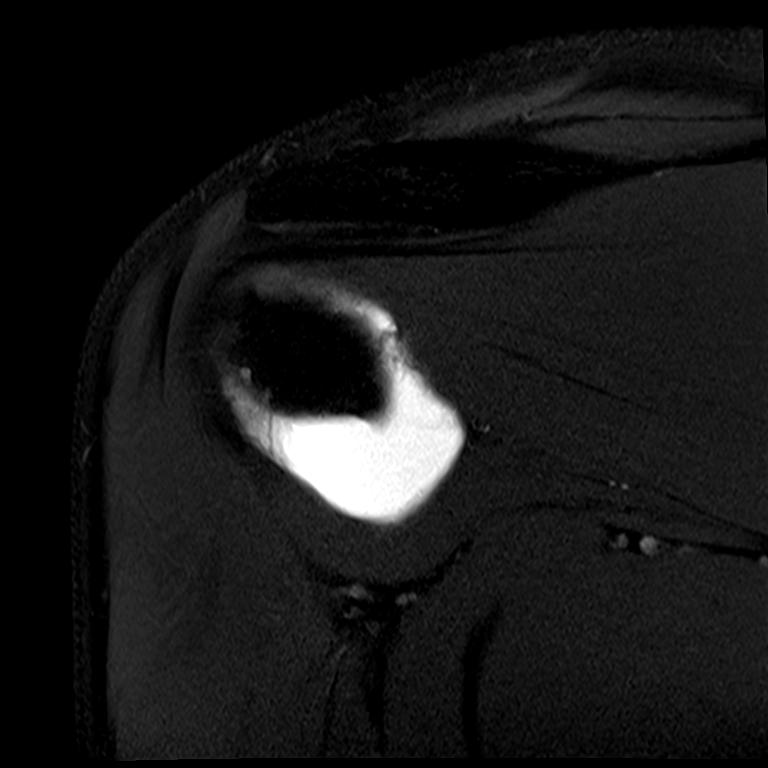

[Series 11: T2 fat-sat · oblique · right · 3.0mm · 0.22mm/px · 3 of 26 slices shown (2 of 2)]
[im 5/26]
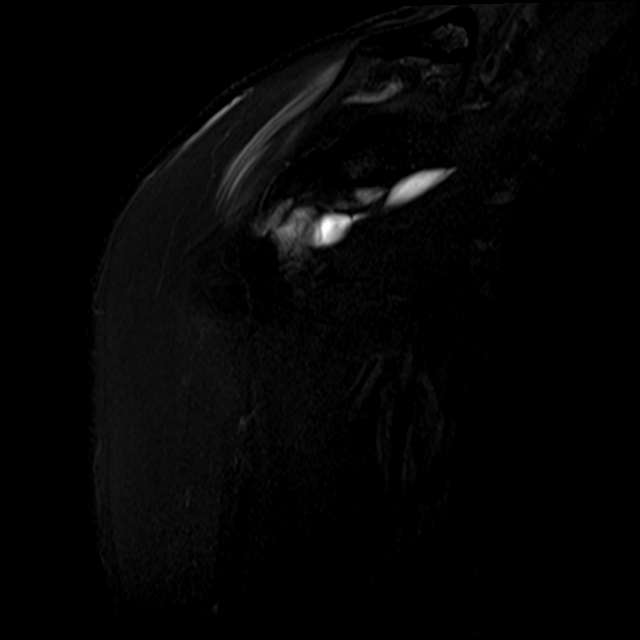
[im 13/26]
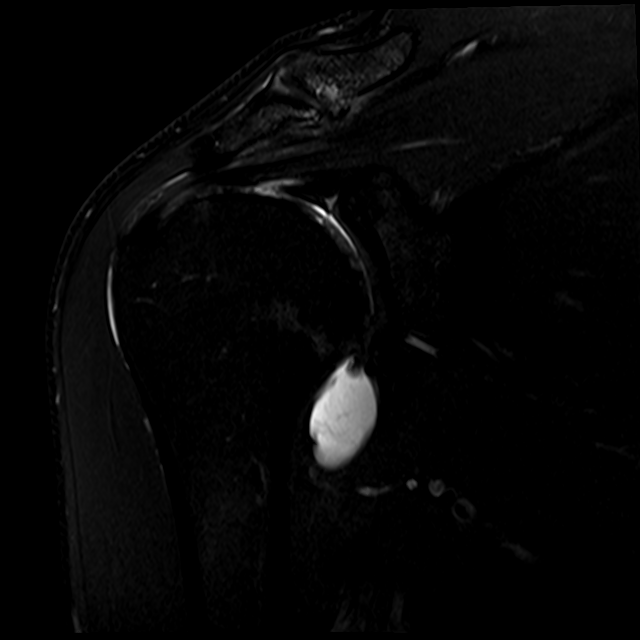
[im 21/26]
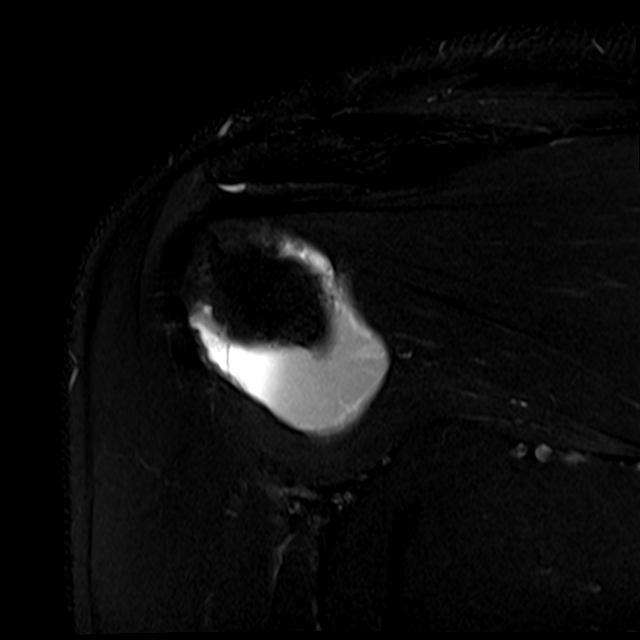

[13 of 40 positions shown; findings below may reference images not displayed]

FINDINGS: Rotator cuff: Mild supraspinatus tendinopathy noted with a small
partial thickness articular surface tear of the distal supraspinatus
tendon on image 12 series 11 and image 20 series 8.

Muscles: Unremarkable

Biceps long head: Unremarkable

Acromioclavicular Joint: Mild spurring and moderate subcortical
marrow edema compatible with moderate degenerative arthropathy.
Subacromial morphology is type 2 (curved). No contrast extension
into the subacromial subdeltoid bursa.

Glenohumeral Joint: Moderate spurring of the right humeral head with
multifocal partial-thickness chondral defects and chondral
irregularity especially along the upper margin of the humeral head,
for example on images 7 through 17 of series 11, with additional
chondral surface irregularity anteriorly along the humeral head.
Glenoid articular cartilage appears reasonably well preserved.

Labrum: Prior labral repair with anterior, posterior, and inferior
screw tracks noted. There is some blunting of the posterior labrum
with mild irregularity along the posterior labor capsular junction
for example on images 14-17 of series 7, but without a well-defined
tear extending through the labral tissue. Contrast along the
superior labrum does not extend substantially posterior to the
biceps anchor. This of subtle linear signal on ABER images along the
anterior inferior chondrolabral junction for example on images 11
through 12 of series 14, but this could well be related to the prior
repair tear given the lack of a well-defined Bankart lesion.

Bones: No significant extra-articular osseous abnormalities
identified.
IMPRESSION: 1. Substantial chondral irregularity along the humeral head
especially superiorly and anteriorly, with multifocal partial
thickness chondral defects. Associated humeral head spurring.
2. Three screw tracks from prior labral repair (anterior, posterior,
and inferior). There is some blunting of the posterior labrum as
well as thin signal which may be due to remote healed tear along the
anterior and anterior inferior labrum, without a substantial amount
of contrast extending into this region. I do not see a well-defined
acute tear. Contrast along the superior labrum does not appear to
extend substantially posterior to the biceps anchor and accordingly
is not thought to constitute a definite SLAP tear.
3. Mild supraspinatus tendinopathy with a partial thickness
articular surface tear of the distal supraspinatus tendon.

## 2022-10-03 IMAGING — XA DG FLUORO GUIDE NDL PLC/BX
1 series · 1 of 1 positions shown · non-contrast
Comparison: none

CLINICAL DATA: Right shoulder pain. History of prior labral repair
8 years ago.

[Series 1: ortho standard · 1 of 1 slices shown]
[im 1/1]
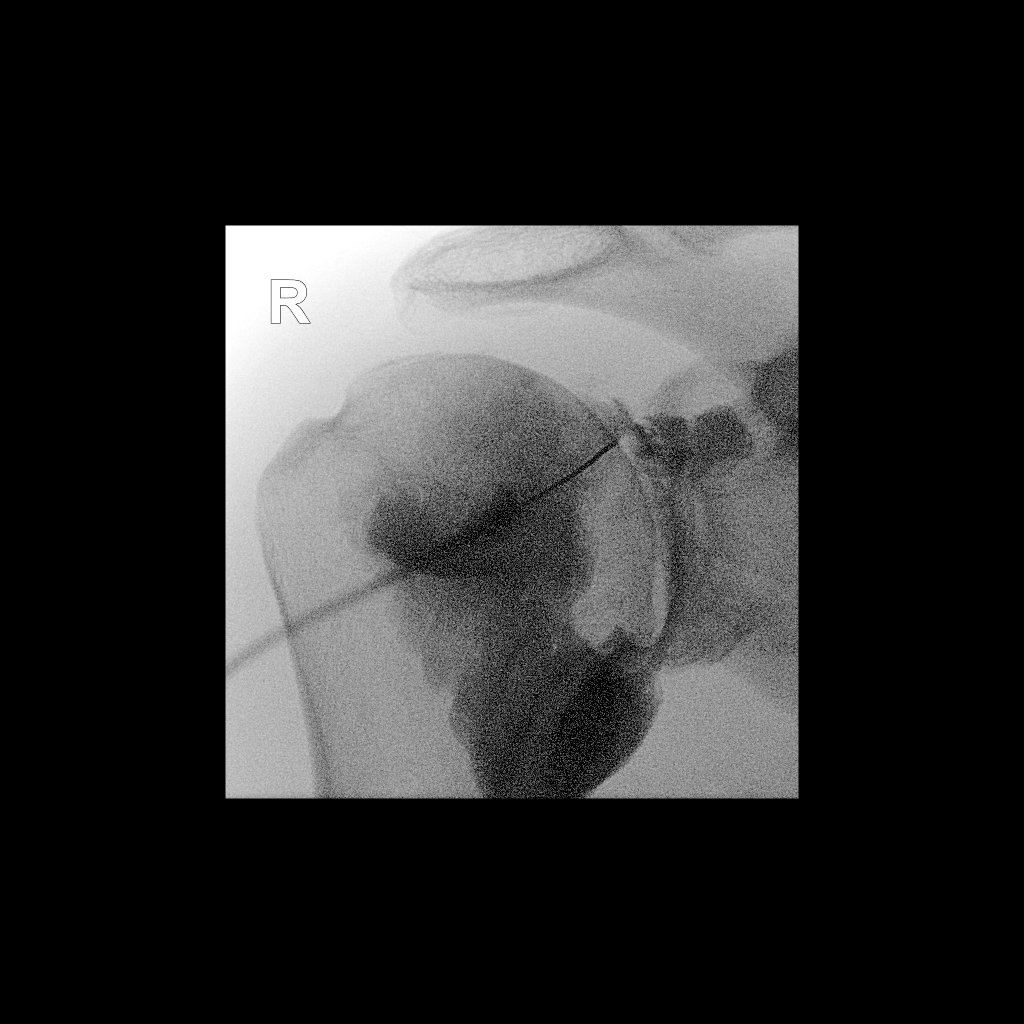

[1 of 1 positions shown; findings below may reference images not displayed]

FLUOROSCOPY TIME:  Radiation Exposure Index (as provided by the
fluoroscopic device): 0.3 mGy

Fluoroscopy Time:  12 seconds

Number of Acquired Images:  0

PROCEDURE:
The risks and benefits of the procedure were discussed with the
patient, and written informed consent was obtained. The patient
stated no history of allergy to contrast media. A formal timeout
procedure was performed with the patient according to departmental
protocol.

The patient was placed supine on the fluoroscopy table and the right
glenohumeral joint was identified under fluoroscopy. The skin
overlying the right glenohumeral joint was subsequently cleaned with
Betadine and a sterile drape was placed over the area of interest. 2
ml 1% Lidocaine was used to anesthetize the skin around the needle
insertion site.

A 22 gauge spinal needle was inserted into the right glenohumeral
joint under fluoroscopy.

12 ml of gadolinium mixture (0.1 ml of Multihance mixed with 15 ml
of Isovue-M 200 contrast and 5 ml of sterile saline) were injected
into the right glenohumeral joint.

The needle was removed and hemostasis was achieved. The patient was
subsequently transferred to MRI for imaging.
IMPRESSION: Technically successful right shoulder injection for MRI.

## 2023-01-29 DIAGNOSIS — H538 Other visual disturbances: Secondary | ICD-10-CM | POA: Diagnosis not present

## 2023-01-29 DIAGNOSIS — R079 Chest pain, unspecified: Secondary | ICD-10-CM | POA: Diagnosis not present

## 2023-01-29 DIAGNOSIS — S0590XA Unspecified injury of unspecified eye and orbit, initial encounter: Secondary | ICD-10-CM | POA: Diagnosis not present

## 2023-01-29 DIAGNOSIS — E785 Hyperlipidemia, unspecified: Secondary | ICD-10-CM | POA: Diagnosis not present

## 2023-02-05 DIAGNOSIS — H3554 Dystrophies primarily involving the retinal pigment epithelium: Secondary | ICD-10-CM | POA: Diagnosis not present

## 2023-02-05 DIAGNOSIS — H25013 Cortical age-related cataract, bilateral: Secondary | ICD-10-CM | POA: Diagnosis not present

## 2023-02-05 DIAGNOSIS — S0511XA Contusion of eyeball and orbital tissues, right eye, initial encounter: Secondary | ICD-10-CM | POA: Diagnosis not present

## 2023-02-05 DIAGNOSIS — H524 Presbyopia: Secondary | ICD-10-CM | POA: Diagnosis not present

## 2023-03-03 DIAGNOSIS — L821 Other seborrheic keratosis: Secondary | ICD-10-CM | POA: Diagnosis not present

## 2023-03-03 DIAGNOSIS — D1801 Hemangioma of skin and subcutaneous tissue: Secondary | ICD-10-CM | POA: Diagnosis not present

## 2023-03-03 DIAGNOSIS — L812 Freckles: Secondary | ICD-10-CM | POA: Diagnosis not present

## 2023-03-05 DIAGNOSIS — E785 Hyperlipidemia, unspecified: Secondary | ICD-10-CM | POA: Diagnosis not present

## 2023-03-05 DIAGNOSIS — I10 Essential (primary) hypertension: Secondary | ICD-10-CM | POA: Diagnosis not present

## 2023-03-05 DIAGNOSIS — R002 Palpitations: Secondary | ICD-10-CM | POA: Diagnosis not present

## 2023-04-14 DIAGNOSIS — H8113 Benign paroxysmal vertigo, bilateral: Secondary | ICD-10-CM | POA: Diagnosis not present

## 2023-04-14 DIAGNOSIS — H6993 Unspecified Eustachian tube disorder, bilateral: Secondary | ICD-10-CM | POA: Diagnosis not present

## 2023-04-14 DIAGNOSIS — J302 Other seasonal allergic rhinitis: Secondary | ICD-10-CM | POA: Diagnosis not present

## 2023-04-14 DIAGNOSIS — J019 Acute sinusitis, unspecified: Secondary | ICD-10-CM | POA: Diagnosis not present

## 2023-05-27 ENCOUNTER — Encounter: Payer: Self-pay | Admitting: Cardiology

## 2023-05-27 ENCOUNTER — Ambulatory Visit

## 2023-05-27 ENCOUNTER — Ambulatory Visit: Attending: Cardiology | Admitting: Cardiology

## 2023-05-27 VITALS — BP 120/78 | HR 65 | Resp 16 | Ht 68.0 in | Wt 156.0 lb

## 2023-05-27 DIAGNOSIS — R002 Palpitations: Secondary | ICD-10-CM | POA: Insufficient documentation

## 2023-05-27 DIAGNOSIS — R0683 Snoring: Secondary | ICD-10-CM | POA: Diagnosis not present

## 2023-05-27 DIAGNOSIS — E782 Mixed hyperlipidemia: Secondary | ICD-10-CM | POA: Insufficient documentation

## 2023-05-27 DIAGNOSIS — E7800 Pure hypercholesterolemia, unspecified: Secondary | ICD-10-CM | POA: Diagnosis not present

## 2023-05-27 NOTE — Progress Notes (Signed)
 Cardiology Office Note:  .   Date:  05/27/2023  ID:  Joshua Yu, DOB Feb 10, 1970, MRN [id number] PCP: Joshua Lathe, MD  [facility] Providers Cardiologist:  Joshua Ivy, MD PCP: Joshua Lathe, MD  Chief Complaint  Patient presents with   Hyperlipidemia   New Patient (Initial Visit)     Joshua Yu is a 53 y.o. male with hypertension, elevated LDL, palpitations  Discussed the use of AI scribe software for clinical note transcription with the patient, who gave verbal consent to proceed.  History of Present Illness Joshua Yu is a 53 year old male with hypertension who presents with palpitations and sleep disturbances. He was referred by Dr. Marily Yu for evaluation of his blood pressure and palpitations.  He experiences palpitations described as a sensation of his heart 'thumping out of my chest' without an increase in pulse rate. This sensation is persistent and similar to feeling nervous. He associates these episodes with vertigo, which he has experienced over the past two years, particularly in February, coinciding with winter colds. He suspects ear infections and sinus congestion may have affected his blood pressure.  He describes a sensation of his body 'vibrating' that wakes him up between 4 and 6 AM, occurring for the past two years and worsening over time. He experiences poor sleep quality, waking up multiple times during the night, and his wife notes heavy snoring. He has attempted various interventions to improve sleep without success.  He is currently taking lisinopril for blood pressure management and recalls experiencing erratic blood pressure, which he attributes to sinus-related issues and vertigo. He does not recall taking any over-the-counter medications for sinus congestion that might have affected his blood pressure.      Vitals:   05/27/23 1443  BP: 120/78  Pulse: 65  Resp: 16  SpO2: 98%      Review of Systems   Cardiovascular:  Positive for palpitations. Negative for chest pain, dyspnea on exertion, leg swelling and syncope.        Studies Reviewed: Joshua Yu        EKG 05/27/2023: Normal sinus rhythm Normal ECG No previous ECGs available  Independently interpreted 01/2023: Chol 218, TG 53, HDL 73, LDL 136 Hb 15.8    Physical Exam Vitals and nursing note reviewed.  Constitutional:      General: He is not in acute distress. Neck:     Vascular: No JVD.  Cardiovascular:     Rate and Rhythm: Normal rate and regular rhythm.     Heart sounds: Normal heart sounds. No murmur heard. Pulmonary:     Effort: Pulmonary effort is normal.     Breath sounds: Normal breath sounds. No wheezing or rales.  Musculoskeletal:     Right lower leg: No edema.     Left lower leg: No edema.      VISIT DIAGNOSES:   ICD-10-CM   1. Mixed hyperlipidemia  E78.2 EKG 12-Lead    2. Snoring  R06.83 [facility] Sleep Study    3. Elevated LDL cholesterol level  E78.00 CT CARDIAC SCORING (SELF PAY ONLY)    4. Palpitations  R00.2 LONG TERM MONITOR (3-14 DAYS)       Joshua Yu is a 53 y.o. male with hypertension, elevated LDL, palpitations  Assessment & Plan Hypertension: Intermittent elevated blood pressure, that patient attributes to vertigo and sinus congestion.  Currently well-controlled on lisinopril, continue same.    Hyperlipidemia: Elevated LDL with family history of heart disease. HDL within normal range.  Calcium score scan discussed for coronary artery disease risk assessment. Statin therapy previously used, currently not on it. Discussed heart-healthy diet: Mediterranean-style with olive oil, whole grains, vegetables, fruits, nuts, fish, lean meat, reduced red meat and processed foods. Recommend CT cardiac scoring scan for risk stratification.  Obstructive Sleep Apnea (suspected): Suspected due to snoring, disturbed sleep, nocturnal awakenings with chest vibration. Possible link to arrhythmias and  cardiovascular symptoms. - Order home sleep study. - In addition, I will also place him on 2-week Zio patch monitor given his complaints of palpitations.         No orders of the defined types were placed in this encounter.    F/u as needed  Signed, Joshua Das, MD

## 2023-05-27 NOTE — Progress Notes (Unsigned)
 Applied a 14 day Zio XT monitor to patient in the office ?

## 2023-05-27 NOTE — Patient Instructions (Signed)
 Testing/Procedures: 2 week zio  Your physician has requested that you wear a Zio heart monitor for __14___ days. This will be mailed to your home with instructions on how to apply the monitor and how to return it when finished. Please allow 2 weeks after returning the heart monitor before our office calls you with the results.   [facility] sleep study  Your physician has recommended that you have a sleep study. This test records several body functions during sleep, including: brain activity, eye movement, oxygen and carbon dioxide blood levels, heart rate and rhythm, breathing rate and rhythm, the flow of air through your mouth and nose, snoring, body muscle movements, and chest and belly movement.   CT Calcium Score  CT scanning for a cardiac calcium score (CAT scanning), is a noninvasive, special x-ray that produces cross-sectional images of the body using x-rays and a computer. CT scans help physicians diagnose and treat medical conditions. For some CT exams, a contrast material is used to enhance visibility in the area of the body being studied. CT scans provide greater clarity and reveal more details than regular x-ray exams.   Follow-Up: At [facility], you and your health needs are our priority.  As part of our continuing mission to provide you with exceptional heart care, our providers are all part of one team.  This team includes your primary Cardiologist (physician) and Advanced Practice Providers or APPs (Physician Assistants and Nurse Practitioners) who all work together to provide you with the care you need, when you need it.   Your next appointment:   As needed  Provider:   Cody Das, MD    We recommend signing up for the patient portal called "MyChart".  Sign up information is provided on this After Visit Summary.  MyChart is used to connect with patients for Virtual Visits (Telemedicine).  Patients are able to view lab/test results, encounter notes, upcoming  appointments, etc.  Non-urgent messages can be sent to your provider as well.   To learn more about what you can do with MyChart, go to [email or web address].

## 2023-06-18 ENCOUNTER — Ambulatory Visit: Payer: Self-pay | Admitting: Cardiology

## 2023-06-18 DIAGNOSIS — R002 Palpitations: Secondary | ICD-10-CM | POA: Diagnosis not present

## 2023-06-27 NOTE — Telephone Encounter (Signed)
 I do not have the results for calcium score and sleep test yet.  I do suspect the shaking is a separate issue, possibly tremor.  Recommend discussing with PCP Dr. Varadarajan.  Thanks MJP

## 2023-07-22 DIAGNOSIS — L821 Other seborrheic keratosis: Secondary | ICD-10-CM | POA: Diagnosis not present

## 2023-07-23 ENCOUNTER — Ambulatory Visit (HOSPITAL_BASED_OUTPATIENT_CLINIC_OR_DEPARTMENT_OTHER)
Admission: RE | Admit: 2023-07-23 | Discharge: 2023-07-23 | Disposition: A | Discharge status: Home / Self Care | Payer: Self-pay | Source: Ambulatory Visit | Attending: Cardiology | Admitting: Cardiology

## 2023-07-23 DIAGNOSIS — E7800 Pure hypercholesterolemia, unspecified: Secondary | ICD-10-CM | POA: Insufficient documentation

## 2023-07-25 ENCOUNTER — Telehealth: Payer: Self-pay

## 2023-07-25 NOTE — Telephone Encounter (Signed)
-----   Message from Nurse Lyle S sent at 07/23/2023  5:31 PM EDT ----- Spoke to patient and he reports that he did not receive a device. Can we please set this up? ----- Message ----- From: Bevely Connell BROCKS, LPN Sent: 02/15/7972  11:11 AM EDT To: Lyle KATHEE Rigg, RN  Can you find out if he has a device? I don't see any documentation or note whether it was given out or patient will pick up later. If no one sends us  a message to call and get them to come in, we just get a Prior Auth for the test and call them with a PIN. ----- Message ----- From: Rigg Lyle KATHEE, RN Sent: 07/22/2023   9:14 AM EDT To: Lucelia Lacey C Shantea Poulton, LPN  I am not sure if he has one or not. There was a time that we ran out of itamars and I was told someone would call the patients to have them come in to set them up with the itamar. ----- Message ----- From: Roslind Michaux C, LPN Sent: 02/15/7972   8:42 AM EDT To: Lyle KATHEE Rigg, RN  I don't see a device registered in Cloudpat for him. Does he have a device or does he need to pick one up? ----- Message ----- From: Rigg Lyle KATHEE, RN Sent: 07/16/2023   5:03 PM EDT To: [facility]  Did we get this patient's results from [facility]?

## 2023-07-25 NOTE — Telephone Encounter (Signed)
 Ordering provider: Patwardhan Associated diagnoses: Snoring, Palpitations WatchPAT PA obtained on 07/25/2023 by Joshua JAYSON Boers, LPN. Authorization: Yes; tracking ID No PA Required Patient notified of PIN (1234) on 07/25/2023 via Notification Method: phone.

## 2023-07-27 NOTE — Progress Notes (Signed)
 Good news.  There is no calcium in your heart arteries.  Recommend heart healthy diet and lifestyle.It may be reasonable to hold off statin therapy for now. Follow up lipid panel with PCP in a year and consider repeat calcium score in 3-5 years.  Regards, Dr. Elmira

## 2023-07-28 ENCOUNTER — Encounter (INDEPENDENT_AMBULATORY_CARE_PROVIDER_SITE_OTHER): Payer: Self-pay | Admitting: Cardiology

## 2023-07-28 ENCOUNTER — Telehealth: Payer: Self-pay | Admitting: Cardiology

## 2023-07-28 DIAGNOSIS — R0683 Snoring: Secondary | ICD-10-CM | POA: Diagnosis not present

## 2023-07-28 NOTE — Telephone Encounter (Signed)
 Patient picked up [facility] Sleep Study in office. Patient Education given. Patient agreement reviewed and signed on 07/28/2023.  WatchPAT issued to patient on 07/28/2023 by Connell JAYSON Boers, LPN.  Patient profile initialized in CloudPAT on 07/28/2023 by Connell Boers, LPN. Device serial number: [id number]  Ordering provider: Patwardhan Associated diagnoses: Snoring WatchPAT PA obtained on 07/28/2023 by Connell JAYSON Boers, LPN. Authorization: Yes; tracking ID No PA Required Patient notified of PIN (1234) on 07/28/2023 via Notification Method: in person.

## 2023-07-28 NOTE — Telephone Encounter (Signed)
 Spoke to pt, see calcium score results.

## 2023-07-28 NOTE — Telephone Encounter (Signed)
Pt returning call in regards to results.  ?

## 2023-08-03 NOTE — Procedures (Signed)
   SLEEP STUDY REPORT Patient Information Study Date: 07/28/2023 Patient Name: Joshua Yu Patient ID: [id number] Birth Date: 1970/04/16 Age: 53 Gender: Male BMI: 23.7 (W=156 lb, H=5' 8'') Stopbang: 5 Referring Physician: Newman Lawrence, MD  TEST DESCRIPTION: Home sleep apnea testing was completed using the WatchPat, a Type 1 device, utilizing peripheral arterial tonometry (PAT), chest movement, actigraphy, pulse oximetry, pulse rate, body position and snore. AHI was calculated with apnea and hypopnea using valid sleep time as the denominator. RDI includes apneas, hypopneas, and RERAs. The data acquired and the scoring of sleep and all associated events were performed in accordance with the recommended standards and specifications as outlined in the AASM Manual for the Scoring of Sleep and Associated Events 2.2.0 (2015).  FINDINGS: 1. No evidence of Obstructive Sleep Apnea with AHI 1.6/hr. 2. No Central Sleep Apnea. 3. Oxygen desaturations as low as 87%. 4. Moderate to severe snoring was present. O2 sats were < 88% for 0 minutes. 5. Total sleep time was 7 hrs and 2 min. 6. 22.4% of total sleep time was spent in REM sleep. 7. Normal sleep onset latency at 16 min. 8. Shortened REM sleep onset latency at 85 min. 9. Total awakenings were 12.  DIAGNOSIS: Normal study with no significant sleep disordered breathing.  RECOMMENDATIONS: 1. Normal study with no significant sleep disordered breathing. 2. Healthy sleep recommendations include: adequate nightly sleep (normal 7-9 hrs/night), avoidance of caffeine after noon and alcohol near bedtime, and maintaining a sleep environment that is cool, dark and quiet. 3. Weight loss for overweight patients is recommended. 4. Snoring recommendations include: weight loss where appropriate, side sleeping, and avoidance of alcohol before bed. 5. Operation of motor vehicle or dangerous equipment must be avoided when feeling drowsy, excessively  sleepy, or mentally fatigued. 6. An ENT consultation which may be useful for specific causes of and possible treatment of bothersome snoring . 7. Weight loss may be of benefit in reducing the severity of snoring.    Signature: Wilbert Bihari, MD; [facility]; Diplomat, American Board of Sleep Medicine Electronically Signed: 08/03/2023 9:04:16 PM

## 2023-08-04 ENCOUNTER — Ambulatory Visit: Attending: Cardiology

## 2023-08-04 DIAGNOSIS — R0683 Snoring: Secondary | ICD-10-CM

## 2023-08-06 ENCOUNTER — Telehealth: Payer: Self-pay | Admitting: *Deleted

## 2023-08-06 NOTE — Telephone Encounter (Signed)
-----   Message from Wilbert Bihari sent at 08/03/2023  9:07 PM EDT ----- Please let patient know that sleep study showed no significant sleep apnea.

## 2023-08-06 NOTE — Telephone Encounter (Signed)
 The patient has been notified of the result and verbalized understanding.  All questions (if any) were answered. Joshua Dalton Seip, CMA 08/06/2023 2:54 PM    Pt is agreeable to normal results.

## 2023-08-18 DIAGNOSIS — E785 Hyperlipidemia, unspecified: Secondary | ICD-10-CM | POA: Diagnosis not present

## 2023-08-18 DIAGNOSIS — Z1211 Encounter for screening for malignant neoplasm of colon: Secondary | ICD-10-CM | POA: Diagnosis not present

## 2023-08-18 DIAGNOSIS — J301 Allergic rhinitis due to pollen: Secondary | ICD-10-CM | POA: Diagnosis not present

## 2023-08-18 DIAGNOSIS — R251 Tremor, unspecified: Secondary | ICD-10-CM | POA: Diagnosis not present

## 2023-08-18 DIAGNOSIS — Z0000 Encounter for general adult medical examination without abnormal findings: Secondary | ICD-10-CM | POA: Diagnosis not present

## 2023-08-26 DIAGNOSIS — Z0000 Encounter for general adult medical examination without abnormal findings: Secondary | ICD-10-CM | POA: Diagnosis not present

## 2023-08-26 DIAGNOSIS — Z125 Encounter for screening for malignant neoplasm of prostate: Secondary | ICD-10-CM | POA: Diagnosis not present

## 2023-11-19 DIAGNOSIS — R2681 Unsteadiness on feet: Secondary | ICD-10-CM | POA: Diagnosis not present

## 2023-11-19 DIAGNOSIS — R42 Dizziness and giddiness: Secondary | ICD-10-CM | POA: Diagnosis not present
